# Patient Record
Sex: Female | Born: 1995 | Race: Black or African American | Hispanic: No | Marital: Single | Smoking: Never smoker
Health system: Southern US, Community
[De-identification: ages and names within clinical notes are randomized; demographics above are authoritative.]

## PROBLEM LIST (undated history)

## (undated) DIAGNOSIS — J45909 Unspecified asthma, uncomplicated: Secondary | ICD-10-CM

## (undated) HISTORY — PX: NO PAST SURGERIES: SHX2092

---
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
## 2014-05-12 ENCOUNTER — Encounter (HOSPITAL_COMMUNITY): Payer: Self-pay

## 2015-12-26 IMAGING — US US OB COMP +14 WK
1 series · 12 of 19 positions shown · non-contrast
Comparison: none

[Series 1: us ob comp +14 wk · 12 of 19 slices shown]
[im 1/19]
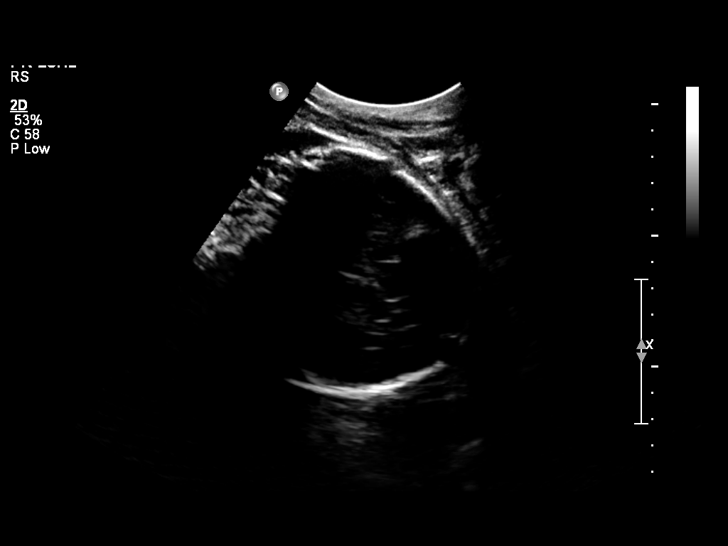
[im 3/19]
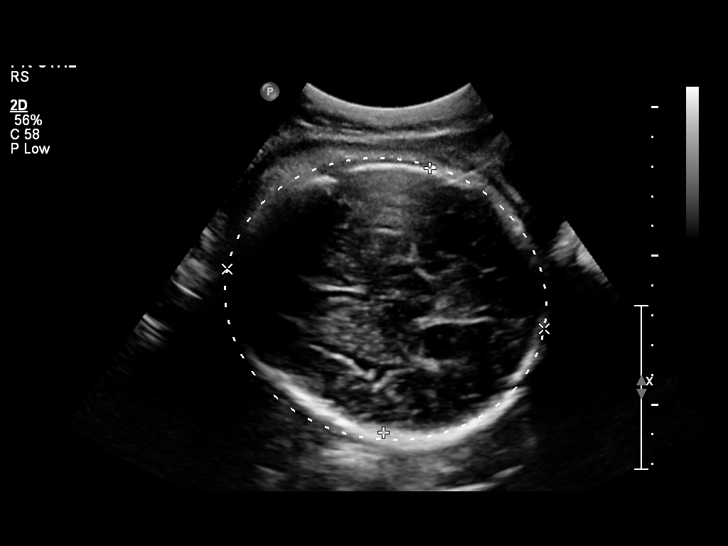
[im 4/19]
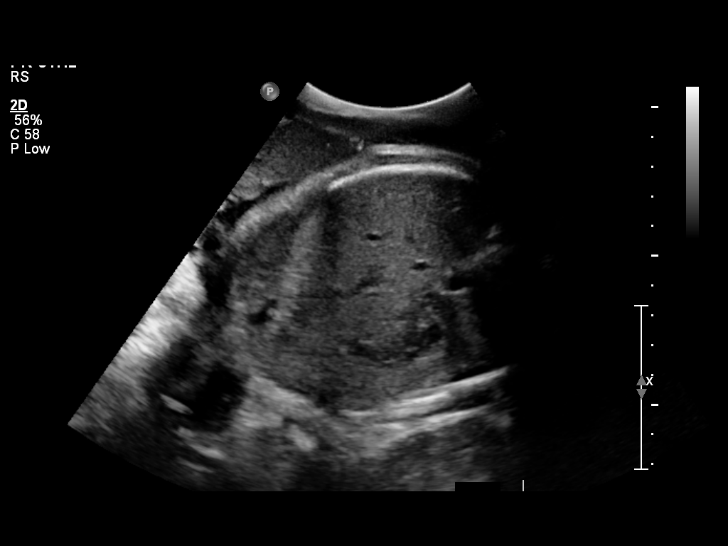
[im 6/19]
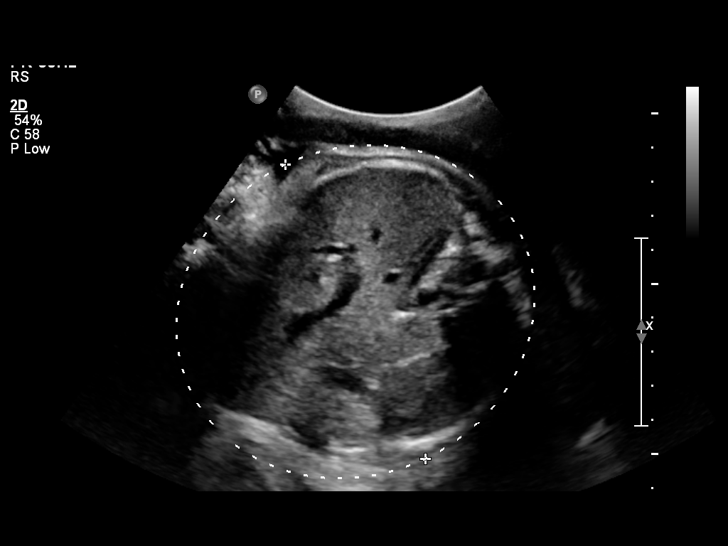
[im 8/19]
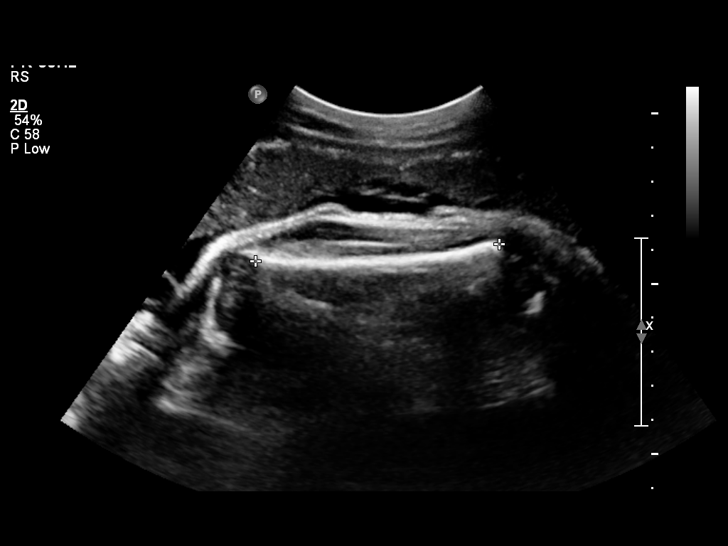
[im 9/19]
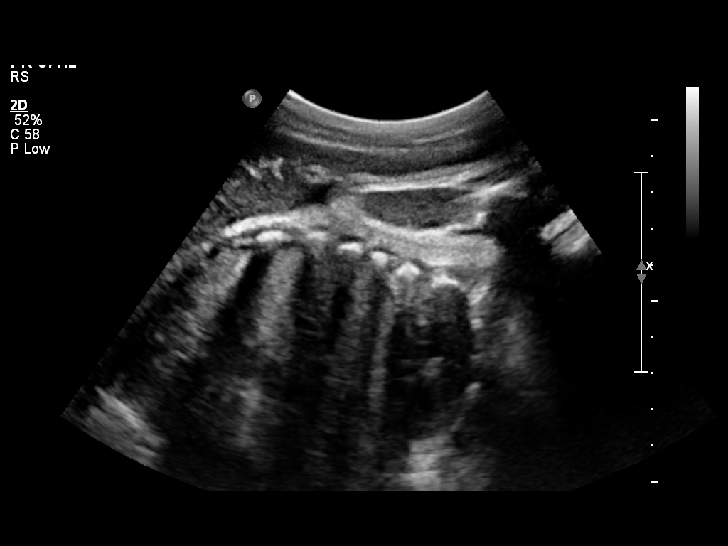
[im 11/19]
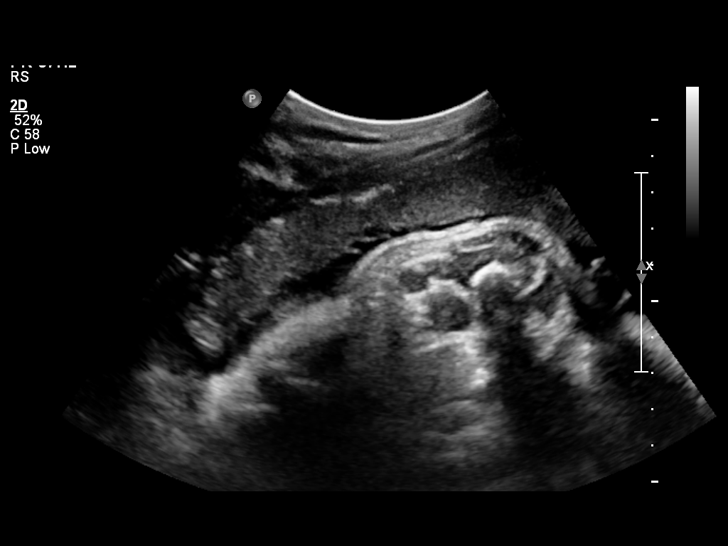
[im 12/19]
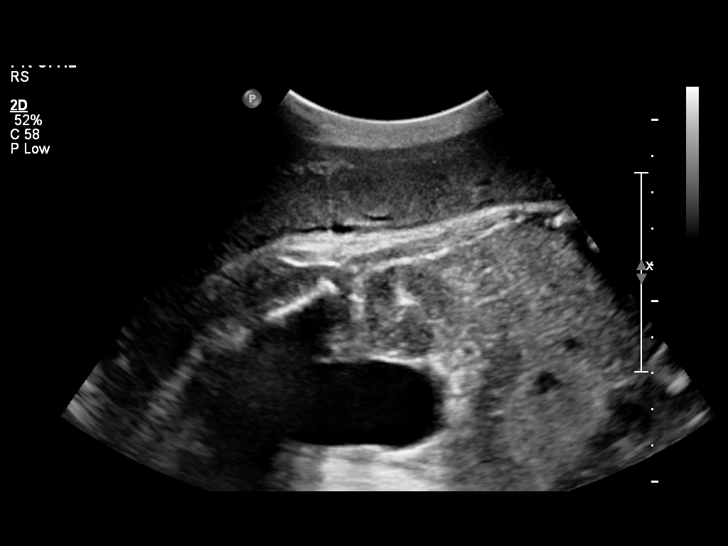
[im 14/19]
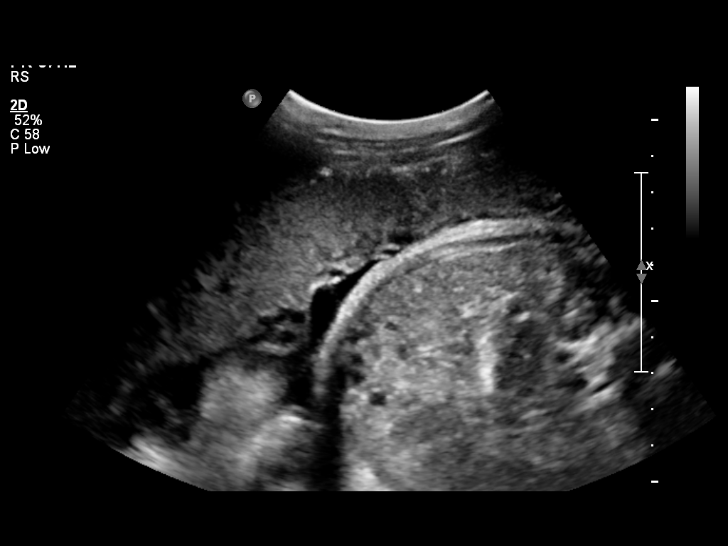
[im 16/19]
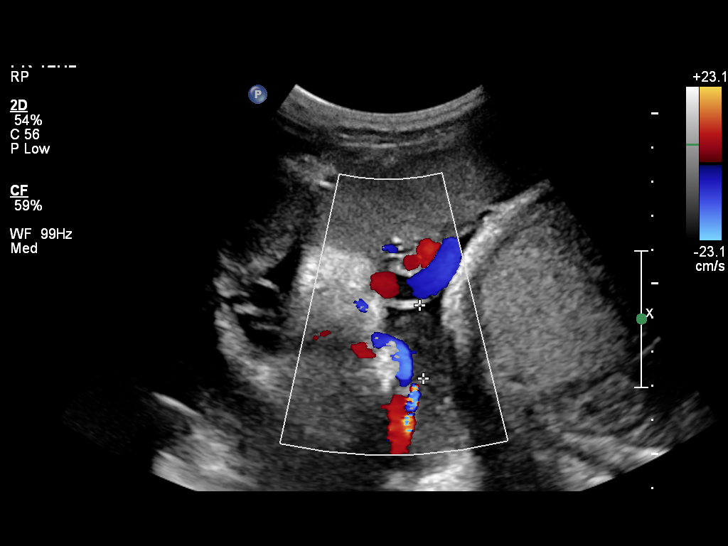
[im 17/19]
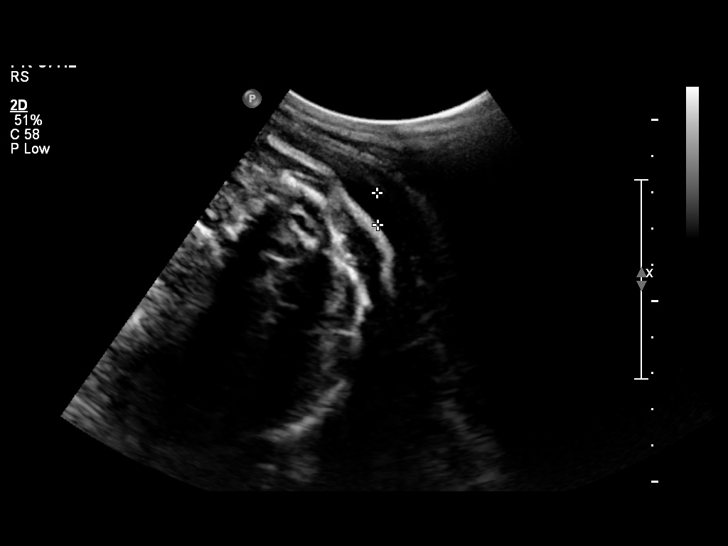
[im 19/19]
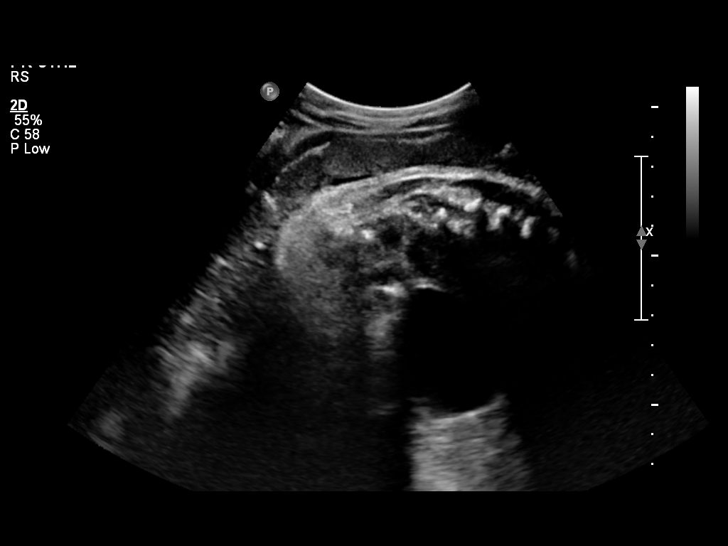

[12 of 19 positions shown; findings below may reference images not displayed]

OBSTETRICS REPORT
                      (Signed Final 12/16/2013 [DATE])

Service(s) Provided

 US OB COMP + 14 WK                                    76805.1
Indications

 No or Little Prenatal Care
 Determine fetal presentation using ultrasound
 Uncertain LMP;  Establish Gestational [AGE]
 Spontaneous  Rupture of Membranes - leaking fluid
Fetal Evaluation

 Num Of Fetuses:    1
 Fetal Heart Rate:  138                          bpm
 Cardiac Activity:  Observed
 Presentation:      Cephalic
 Placenta:          Anterior, above cervical os

 Amniotic Fluid
 AFI FV:      Subjectively decreased
                                             Larg Pckt:     2.1  cm
Biometry

 BPD:     89.9  mm     G. Age:  36w 3d                CI:        79.81   70 - 86
                                                      FL/HC:      22.0   20.1 -

 HC:       318  mm     G. Age:  35w 6d       19  %    HC/AC:      1.02   0.93 -

 AC:     313.3  mm     G. Age:  35w 2d       42  %    FL/BPD:     77.8   71 - 87
 FL:      69.9  mm     G. Age:  35w 6d       45  %    FL/AC:      22.3   20 - 24

 Est. FW:    8715  gm           6 lb     58  %
Gestational Age

 LMP:           39w 0d        Date:  03/17/13                 EDD:   12/22/13
 U/S Today:     35w 6d                                        EDD:   01/13/14
 Best:          35w 6d     Det. By:  U/S (12/15/13)           EDD:   01/13/14
Anatomy

 Cranium:          Appears normal         Aortic Arch:      Not well visualized
 Fetal Cavum:      Not well visualized    Ductal Arch:      Not well visualized
 Ventricles:       Not well visualized    Diaphragm:        Not well visualized
 Choroid Plexus:   Not well visualized    Stomach:          Appears normal, left
                                                            sided
 Cerebellum:       Not well visualized    Abdomen:          Not well visualized
 Posterior Fossa:  Not well visualized    Abdominal Wall:   Not well visualized
 Nuchal Fold:      Not applicable (>20    Cord Vessels:     Not well visualized
                   wks GA)
 Face:             Not well visualized    Kidneys:          Appear normal
 Lips:             Not well visualized    Bladder:          Appears normal
 Palate:           Not well visualized    Spine:            Not well visualized
 Heart:            Not well visualized    Lower             Not well visualized
                                          Extremities:
 RVOT:             Not well visualized    Upper             Not well visualized
                                          Extremities:
 LVOT:             Not well visualized
Impression

 SIUP at 21w9d on remote interpretation of ultrasound
 examination in context of SROM and CTX
 cephalic presentation
 EFW 58th%
 No dysmorphic features, noting exam limited by decreased
 amniotic fluid and uterine contractions
 no previa
Recommendations

 Management as clinically indicated.

 questions or concerns.
